# Patient Record
Sex: Male | Born: 1990 | Race: Black or African American | Hispanic: No | Marital: Single | Smoking: Current every day smoker
Health system: Southern US, Community
[De-identification: ages and names within clinical notes are randomized; demographics above are authoritative.]

---
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
## 2012-04-17 ENCOUNTER — Emergency Department (HOSPITAL_COMMUNITY)
Admission: EM | Admit: 2012-04-17 | Discharge: 2012-04-17 | Disposition: A | Discharge status: Home / Self Care | Payer: Self-pay | Attending: Emergency Medicine | Admitting: Emergency Medicine

## 2012-04-17 ENCOUNTER — Emergency Department (HOSPITAL_COMMUNITY): Payer: Self-pay

## 2012-04-17 ENCOUNTER — Encounter (HOSPITAL_COMMUNITY): Payer: Self-pay

## 2012-04-17 DIAGNOSIS — L03319 Cellulitis of trunk, unspecified: Secondary | ICD-10-CM | POA: Insufficient documentation

## 2012-04-17 DIAGNOSIS — A55 Chlamydial lymphogranuloma (venereum): Secondary | ICD-10-CM | POA: Insufficient documentation

## 2012-04-17 DIAGNOSIS — L02219 Cutaneous abscess of trunk, unspecified: Secondary | ICD-10-CM | POA: Insufficient documentation

## 2012-04-17 DIAGNOSIS — F17200 Nicotine dependence, unspecified, uncomplicated: Secondary | ICD-10-CM | POA: Insufficient documentation

## 2012-04-17 LAB — URINALYSIS, ROUTINE W REFLEX MICROSCOPIC
Glucose, UA: NEGATIVE mg/dL
Hgb urine dipstick: NEGATIVE
Ketones, ur: 15 mg/dL — AB
Nitrite: NEGATIVE
Protein, ur: NEGATIVE mg/dL
Specific Gravity, Urine: 1.027 (ref 1.005–1.030)
Urobilinogen, UA: 1 mg/dL (ref 0.0–1.0)
pH: 6.5 (ref 5.0–8.0)

## 2012-04-17 MED ORDER — LIDOCAINE HCL (PF) 1 % IJ SOLN
INTRAMUSCULAR | Status: AC
  Administered 2012-04-17: 5 mL via INTRAMUSCULAR
  Filled 2012-04-17: qty 5

## 2012-04-17 MED ORDER — ERYTHROMYCIN BASE 500 MG PO TABS: 500.0000 mg | ORAL_TABLET | Freq: Four times a day (QID) | ORAL | Status: AC

## 2012-04-17 MED ORDER — CEFTRIAXONE SODIUM 250 MG IJ SOLR
250.0000 mg | Freq: Once | INTRAMUSCULAR | Status: AC
  Administered 2012-04-17: 250 mg via INTRAMUSCULAR
  Filled 2012-04-17: qty 250

## 2012-04-17 MED ORDER — HYDROCODONE-ACETAMINOPHEN 5-325 MG PO TABS
2.0000 | ORAL_TABLET | Freq: Once | ORAL | Status: AC
  Administered 2012-04-17: 2 via ORAL
  Filled 2012-04-17: qty 2

## 2012-04-17 MED ORDER — HYDROCODONE-ACETAMINOPHEN 5-325 MG PO TABS: 2.0000 | ORAL_TABLET | Freq: Four times a day (QID) | ORAL | Status: AC | PRN

## 2012-04-17 NOTE — ED Provider Notes (Cosign Needed)
History  This chart was scribed for Ward Givens, MD by Erskine Emery. This patient was seen in room TR10C/TR10C and the patient's care was started at 14:35   CSN: [id number]  Arrival date & time 04/17/12  1324   First MD Initiated Contact with Patient 04/17/12 1435      Chief Complaint  Patient presents with  . Rash    (Consider location/radiation/quality/duration/timing/severity/associated sxs/prior treatment) HPI  Christian Ford is a 21 y.o. male who presents to the Emergency Department with rash. Pt reports a new sexual partner 2-3 weeks ago while at the beach without any condom usage. He reports he did have drainage from his penis that was white one or 2 weeks ago, however that has resolved. He denies any dysuria or frequency. He states he had painless lesions on his penis and shaft of his penis that are still persistent. He relates sometimes he gets scabs off of these lesions. He relates he started getting swelling in his left groin yesterday. He denies fever, or pain. Denies any new exposures or detergents. He states he did change his detergent however that was after he started getting a rash. Reports some rash on his body.  History reviewed. No pertinent past medical history.  History reviewed. No pertinent past surgical history.  History reviewed. No pertinent family history.  History  Substance Use Topics  . Smoking status: Current Everyday Smoker  . Smokeless tobacco: Not on file  . Alcohol Use: Yes  unemployed    Review of Systems  Constitutional: Negative for fever and chills.  Respiratory: Negative for shortness of breath.   Gastrointestinal: Negative for nausea and vomiting.  Genitourinary: Positive for discharge and penile pain. Negative for dysuria, urgency and frequency.  Skin: Positive for rash.  Neurological: Negative for weakness.    Allergies  Review of patient's allergies indicates no known allergies.  Home Medications   None   BP 108/63   Pulse 93  Temp 99.6 F (37.6 C) (Oral)  Resp 18  SpO2 98%   Vital signs normal    Physical Exam  Nursing note and vitals reviewed. Constitutional: He is oriented to person, place, and time. He appears well-developed and well-nourished. No distress.  HENT:  Head: Normocephalic and atraumatic.  Right Ear: External ear normal.  Left Ear: External ear normal.  Eyes: Conjunctivae and EOM are normal. Pupils are equal, round, and reactive to light.  Neck: Normal range of motion. Neck supple. No tracheal deviation present.  Pulmonary/Chest: Effort normal. No respiratory distress.  Abdominal: Soft. Bowel sounds are normal. He exhibits no distension. There is no tenderness. There is no rebound and no guarding.  Genitourinary:       Large swollen area in left inguinal area about 7x5 cm in size. Also some swelling inferior to inguinal fold. Early groove sign in left inguinal area.  Small papular lesions on head and shaft of penis. Circumcised. Excoriations and crustiness present. No penile drip. Normal testicles and scrotum.  Musculoskeletal: Normal range of motion.  Neurological: He is alert and oriented to person, place, and time.  Skin: Skin is warm and dry. Rash noted.       2 mm raised skin colored lesions on lateral and medial aspect of fingers and hands but not on palms. A few small round lesions on arms and waistline.   Psychiatric: He has a normal mood and affect. His behavior is normal.    ED Course  Procedures (including critical care time)  Medications  cefTRIAXone (ROCEPHIN) injection 250 mg (not administered)  HYDROcodone-acetaminophen (NORCO/VICODIN) 5-325 MG per tablet 2 tablet (2 tablet Oral Given 04/17/12 1519)   16:40 Dr Constance Goltz called about [place].   DIAGNOSTIC STUDIES: Oxygen Saturation is 98% on room air, normal by my interpretation.    COORDINATION OF CARE: 15:05--I evaluated the pt and we discussed treatment plan including ultrasound and STD testing with pt and pt  agreed.  15:15--Medication order: Hydrocodone-acetaminophen (Norco/Vicodin) 5-325 mg per tablet, 2 tablets--once  17:22--I rechecked the pt and notified him that we would be doing an incision and drainage procedure.   18:24--INCISION AND DRAINAGE PROCEDURE NOTE: Patient identification was confirmed and verbal consent was obtained. This procedure was performed by Ward Givens, MD at 6:19 PM.  I was unsuccessful in finding the abscess cavity.   Site: left inguinal in the middle of the swollen area Anesthetic used (type and amt): 1% epi Blade size: 11 Drainage: none  I used bedside ultrasound to try to localize the area of possible abscess the radiologist was talking about however it appeared to be in that same area and no drainage was obtained.  Site anesthetized, incision made over site, wound drained and explored loculations, covered with dry, sterile dressing.  Pt tolerated procedure well without complications.  Instructions for care discussed verbally and pt provided with additional written instructions for homecare and f/u.   Results for orders placed during the hospital encounter of 04/17/12  URINALYSIS, ROUTINE W REFLEX MICROSCOPIC      Component Value Range   Color, Urine AMBER (*) YELLOW   APPearance CLEAR  CLEAR   Specific Gravity, Urine 1.027  1.005 - 1.030   pH 6.5  5.0 - 8.0   Glucose, UA NEGATIVE  NEGATIVE mg/dL   Hgb urine dipstick NEGATIVE  NEGATIVE   Bilirubin Urine SMALL (*) NEGATIVE   Ketones, ur 15 (*) NEGATIVE mg/dL   Protein, ur NEGATIVE  NEGATIVE mg/dL   Urobilinogen, UA 1.0  0.0 - 1.0 mg/dL   Nitrite NEGATIVE  NEGATIVE   Leukocytes, UA TRACE (*) NEGATIVE  URINE MICROSCOPIC-ADD ON      Component Value Range   Squamous Epithelial / LPF RARE  RARE   WBC, UA 0-2  <3 WBC/hpf   Bacteria, UA RARE  RARE   Urine-Other MUCOUS PRESENT     Laboratory interpretation all normal except    US Pelvis Limited  04/17/2012  *RADIOLOGY REPORT*  Clinical Data:  Evaluate for abscess/lymphadenopathy.  US PELVIS LIMITED OR FOLLOW UP  Technique:  Complete ultrasound examination of the testicles, epididymis, and other scrotal structures was performed.  Comparison: None.  Findings: Just above the left scrotum is a complex 3.9 x 2.1 x 4.1 cm fluid appearing structure.  There is slight redness over this region per ultrasound technologist.  It is therefore possible this represents an abscess.  Superior to this level, lymph nodes measure 1.6 x 0.8 x 1.4 cm and 2.0 x 0.5 x 0.7 cm.  Results discussed with Dr. Lynelle Doctor 04/17/2012 4:40 p.m.  IMPRESSION: Just above the left scrotum is a complex 3.9 x 2.1 x 4.1 cm fluid appearing structure.  There is slight redness over this region per ultrasound technologist.  It is therefore possible this represents an abscess.  Superior to this level, lymph nodes measure 1.6 x 0.8 x 1.4 cm and 2.0 x 0.5 x 0.7 cm.  Original Report Authenticated By: Fuller Canada, M.D.    1. LGV (lymphogranuloma venereum)     Patient's Medications  New Prescriptions   ERYTHROMYCIN BASE (E-MYCIN) 500 MG TABLET    Take 1 tablet (500 mg total) by mouth 4 (four) times daily.   HYDROCODONE-ACETAMINOPHEN (NORCO/VICODIN) 5-325 MG PER TABLET    Take 2 tablets by mouth every 6 (six) hours as needed for pain.    Plan discharge  Devoria Albe, MD, FACEP   MDM   I personally performed the services described in this documentation, which was scribed in my presence. The recorded information has been reviewed and considered.  Devoria Albe, MD, Armando Gang          Ward Givens, MD 04/17/12 2036

## 2012-04-17 NOTE — ED Notes (Signed)
Pt a&ox4, airway intact, ambulates with a steady gait. Pt denies any questions regarding d/c instructions. Pt leaving with his mother.

## 2012-04-17 NOTE — ED Notes (Signed)
Pt complains of rash to body and sts broke out on his penis and knots in groin area.

## 2012-04-18 LAB — GC/CHLAMYDIA PROBE AMP, GENITAL
Chlamydia, DNA Probe: NEGATIVE
GC Probe Amp, Genital: NEGATIVE

## 2012-04-18 LAB — RPR: RPR Ser Ql: NONREACTIVE

## 2012-04-21 ENCOUNTER — Telehealth (HOSPITAL_COMMUNITY): Payer: Self-pay | Admitting: Emergency Medicine

## 2013-12-08 ENCOUNTER — Encounter (HOSPITAL_BASED_OUTPATIENT_CLINIC_OR_DEPARTMENT_OTHER): Payer: Self-pay | Admitting: Emergency Medicine

## 2013-12-08 ENCOUNTER — Emergency Department (HOSPITAL_BASED_OUTPATIENT_CLINIC_OR_DEPARTMENT_OTHER)
Admission: EM | Admit: 2013-12-08 | Discharge: 2013-12-08 | Disposition: A | Discharge status: Home / Self Care | Payer: Self-pay | Attending: Emergency Medicine | Admitting: Emergency Medicine

## 2013-12-08 DIAGNOSIS — R112 Nausea with vomiting, unspecified: Secondary | ICD-10-CM | POA: Insufficient documentation

## 2013-12-08 DIAGNOSIS — R509 Fever, unspecified: Secondary | ICD-10-CM | POA: Insufficient documentation

## 2013-12-08 DIAGNOSIS — R1084 Generalized abdominal pain: Secondary | ICD-10-CM | POA: Insufficient documentation

## 2013-12-08 DIAGNOSIS — F17200 Nicotine dependence, unspecified, uncomplicated: Secondary | ICD-10-CM | POA: Insufficient documentation

## 2013-12-08 DIAGNOSIS — R197 Diarrhea, unspecified: Secondary | ICD-10-CM | POA: Insufficient documentation

## 2013-12-08 LAB — URINALYSIS, ROUTINE W REFLEX MICROSCOPIC
BILIRUBIN URINE: NEGATIVE
Glucose, UA: NEGATIVE mg/dL
Hgb urine dipstick: NEGATIVE
KETONES UR: NEGATIVE mg/dL
Leukocytes, UA: NEGATIVE
NITRITE: NEGATIVE
PROTEIN: 30 mg/dL — AB
SPECIFIC GRAVITY, URINE: 1.018 (ref 1.005–1.030)
UROBILINOGEN UA: 1 mg/dL (ref 0.0–1.0)
pH: 6.5 (ref 5.0–8.0)

## 2013-12-08 LAB — URINE MICROSCOPIC-ADD ON: RBC / HPF: NONE SEEN RBC/hpf (ref <3)

## 2013-12-08 LAB — CBC WITH DIFFERENTIAL/PLATELET
BASOS PCT: 0 % (ref 0–1)
Basophils Absolute: 0 10*3/uL (ref 0.0–0.1)
EOS ABS: 0.1 10*3/uL (ref 0.0–0.7)
EOS PCT: 1 % (ref 0–5)
HCT: 45.5 % (ref 39.0–52.0)
Hemoglobin: 16.6 g/dL (ref 13.0–17.0)
Lymphocytes Relative: 13 % (ref 12–46)
Lymphs Abs: 1.3 10*3/uL (ref 0.7–4.0)
MCH: 30.9 pg (ref 26.0–34.0)
MCHC: 36.5 g/dL — AB (ref 30.0–36.0)
MCV: 84.7 fL (ref 78.0–100.0)
Monocytes Absolute: 1.2 10*3/uL — ABNORMAL HIGH (ref 0.1–1.0)
Monocytes Relative: 12 % (ref 3–12)
NEUTROS PCT: 74 % (ref 43–77)
Neutro Abs: 7.5 10*3/uL (ref 1.7–7.7)
PLATELETS: 167 10*3/uL (ref 150–400)
RBC: 5.37 MIL/uL (ref 4.22–5.81)
RDW: 12 % (ref 11.5–15.5)
WBC: 10.1 10*3/uL (ref 4.0–10.5)

## 2013-12-08 LAB — I-STAT CHEM 8, ED
BUN: <3 mg/dL — ABNORMAL LOW (ref 6–23)
Calcium, Ion: 1.25 mmol/L — ABNORMAL HIGH (ref 1.12–1.23)
Chloride: 98 mEq/L (ref 96–112)
Creatinine, Ser: 1.1 mg/dL (ref 0.50–1.35)
Glucose, Bld: 112 mg/dL — ABNORMAL HIGH (ref 70–99)
HEMATOCRIT: 51 % (ref 39.0–52.0)
HEMOGLOBIN: 17.3 g/dL — AB (ref 13.0–17.0)
POTASSIUM: 3.8 meq/L (ref 3.7–5.3)
SODIUM: 137 meq/L (ref 137–147)
TCO2: 26 mmol/L (ref 0–100)

## 2013-12-08 MED ORDER — PROMETHAZINE HCL 25 MG PO TABS: 25.0000 mg | ORAL_TABLET | Freq: Four times a day (QID) | ORAL | Status: AC | PRN

## 2013-12-08 MED ORDER — ONDANSETRON HCL 4 MG/2ML IJ SOLN
4.0000 mg | Freq: Once | INTRAMUSCULAR | Status: AC
  Administered 2013-12-08: 4 mg via INTRAVENOUS
  Filled 2013-12-08: qty 2

## 2013-12-08 MED ORDER — SODIUM CHLORIDE 0.9 % IV BOLUS (SEPSIS)
1000.0000 mL | Freq: Once | INTRAVENOUS | Status: AC
  Administered 2013-12-08: 1000 mL via INTRAVENOUS

## 2013-12-08 MED ORDER — DICYCLOMINE HCL 20 MG PO TABS: 20.0000 mg | ORAL_TABLET | Freq: Two times a day (BID) | ORAL | Status: AC

## 2013-12-08 MED ORDER — DICYCLOMINE HCL 10 MG PO CAPS
10.0000 mg | ORAL_CAPSULE | Freq: Once | ORAL | Status: AC
  Administered 2013-12-08: 10 mg via ORAL
  Filled 2013-12-08: qty 1

## 2013-12-08 NOTE — Discharge Instructions (Signed)
Viral Gastroenteritis Viral gastroenteritis is also known as stomach flu. This condition affects the stomach and intestinal tract. It can cause sudden diarrhea and vomiting. The illness typically lasts 3 to 8 days. Most people develop an immune response that eventually gets rid of the virus. While this natural response develops, the virus can make you quite ill. CAUSES  Many different viruses can cause gastroenteritis, such as rotavirus or noroviruses. You can catch one of these viruses by consuming contaminated food or water. You may also catch a virus by sharing utensils or other personal items with an infected person or by touching a contaminated surface. SYMPTOMS  The most common symptoms are diarrhea and vomiting. These problems can cause a severe loss of body fluids (dehydration) and a body salt (electrolyte) imbalance. Other symptoms may include:  Fever.  Headache.  Fatigue.  Abdominal pain. DIAGNOSIS  Your caregiver can usually diagnose viral gastroenteritis based on your symptoms and a physical exam. A stool sample may also be taken to test for the presence of viruses or other infections. TREATMENT  This illness typically goes away on its own. Treatments are aimed at rehydration. The most serious cases of viral gastroenteritis involve vomiting so severely that you are not able to keep fluids down. In these cases, fluids must be given through an intravenous line (IV). HOME CARE INSTRUCTIONS   Drink enough fluids to keep your urine clear or pale yellow. Drink small amounts of fluids frequently and increase the amounts as tolerated.  Ask your caregiver for specific rehydration instructions.  Avoid:  Foods high in sugar.  Alcohol.  Carbonated drinks.  Tobacco.  Juice.  Caffeine drinks.  Extremely hot or cold fluids.  Fatty, greasy foods.  Too much intake of anything at one time.  Dairy products until 24 to 48 hours after diarrhea stops.  You may consume probiotics.  Probiotics are active cultures of beneficial bacteria. They may lessen the amount and number of diarrheal stools in adults. Probiotics can be found in yogurt with active cultures and in supplements.  Wash your hands well to avoid spreading the virus.  Only take over-the-counter or prescription medicines for pain, discomfort, or fever as directed by your caregiver. Do not give aspirin to children. Antidiarrheal medicines are not recommended.  Ask your caregiver if you should continue to take your regular prescribed and over-the-counter medicines.  Keep all follow-up appointments as directed by your caregiver. SEEK IMMEDIATE MEDICAL CARE IF:   You are unable to keep fluids down.  You do not urinate at least once every 6 to 8 hours.  You develop shortness of breath.  You notice blood in your stool or vomit. This may look like coffee grounds.  You have abdominal pain that increases or is concentrated in one small area (localized).  You have persistent vomiting or diarrhea.  You have a fever.  The patient is a child younger than 3 months, and he or she has a fever.  The patient is a child older than 3 months, and he or she has a fever and persistent symptoms.  The patient is a child older than 3 months, and he or she has a fever and symptoms suddenly get worse.  The patient is a baby, and he or she has no tears when crying. MAKE SURE YOU:   Understand these instructions.  Will watch your condition.  Will get help right away if you are not doing well or get worse. Document Released: 09/16/2005 Document Revised: 12/09/2011 Document Reviewed: 07/03/2011   ExitCare Patient Information 2014 [employer].  

## 2013-12-08 NOTE — ED Notes (Signed)
Nausea and vomiting diarrhea yesterday today only diarrhea and having some mid abdominal pain.

## 2013-12-08 NOTE — ED Provider Notes (Signed)
TIME SEEN: 9:31 AM  CHIEF COMPLAINT: Vomiting, diarrhea, abdominal pain  HPI: Patient is a 23 year old male with no significant past medical history who presents to the emergency department with 3 days of nausea, vomiting and diarrhea. He's also complaining of diffuse abdominal cramping. States he has had low-grade fever at home. No chills. No sick contacts, recent travel, hospitalization or antibiotic use. Denies any dysuria, hematuria, penile discharge, testicular pain or scrotal swelling. No bloody stool or melena.  ROS: See HPI Constitutional: no fever  Eyes: no drainage  ENT: no runny nose   Cardiovascular:  no chest pain  Resp: no SOB  GI:  vomiting GU: no dysuria Integumentary: no rash  Allergy: no hives  Musculoskeletal: no leg swelling  Neurological: no slurred speech ROS otherwise negative  PAST MEDICAL HISTORY/PAST SURGICAL HISTORY:  History reviewed. No pertinent past medical history.  MEDICATIONS:  Prior to Admission medications   Not on File    ALLERGIES:  No Known Allergies  SOCIAL HISTORY:  History  Substance Use Topics  . Smoking status: Current Every Day Smoker  . Smokeless tobacco: Not on file  . Alcohol Use: Yes    FAMILY HISTORY: History reviewed. No pertinent family history.  EXAM: BP 124/83  Pulse 78  Temp(Src) 98.3 F (36.8 C) (Oral)  Resp 16  Ht 5\' 9"  (1.753 m)  Wt 165 lb (74.844 kg)  BMI 24.36 kg/m2  SpO2 100% CONSTITUTIONAL: Alert and oriented and responds appropriately to questions. Well-appearing; well-nourished, in no apparent distress HEAD: Normocephalic EYES: Conjunctivae clear, PERRL ENT: normal nose; no rhinorrhea; slightly dry mucous membranes; pharynx without lesions noted NECK: Supple, no meningismus, no LAD  CARD: RRR; S1 and S2 appreciated; no murmurs, no clicks, no rubs, no gallops RESP: Normal chest excursion without splinting or tachypnea; breath sounds clear and equal bilaterally; no wheezes, no rhonchi, no rales,   ABD/GI: Normal bowel sounds; non-distended; soft, non-tender, no rebound, no guarding; no peritoneal signs BACK:  The back appears normal and is non-tender to palpation, there is no CVA tenderness EXT: Normal ROM in all joints; non-tender to palpation; no edema; normal capillary refill; no cyanosis    SKIN: Normal color for age and race; warm NEURO: Moves all extremities equally PSYCH: The patient's mood and manner are appropriate. Grooming and personal hygiene are appropriate.  MEDICAL DECISION MAKING: Patient here with vomiting, diarrhea and crampy abdominal pain. Suspect viral illness. His abdominal exam is completely benign. His vital signs within normal limits. We'll check basic labs, urine. Will give IV fluids, Zofran and Bentyl.  ED PROGRESS: Patient's labs and urine are completely unremarkable. He has no leukocytosis or electrolyte abnormality. His anion gap is 13. He states he feels much better after Zofran and Bentyl. We'll discharge with prescription for the same. Have given return precautions. Patient and family at bedside verbalize understanding and are comfortable with this plan.     Layla MawKristen N Ward, DO 12/08/13 1038

## 2014-02-14 IMAGING — US US PELVIS LIMITED
1 series · 14 of 25 positions shown · non-contrast
Comparison: None.

CLINICAL DATA: Evaluate for abscess/lymphadenopathy.

US PELVIS LIMITED OR FOLLOW UP
TECHNIQUE: Complete ultrasound examination of the testicles,
epididymis, and other scrotal structures was performed.

[Series 1: us pelvis limited · 0.09mm/px · 14 of 47 slices shown]
[im 1/47]
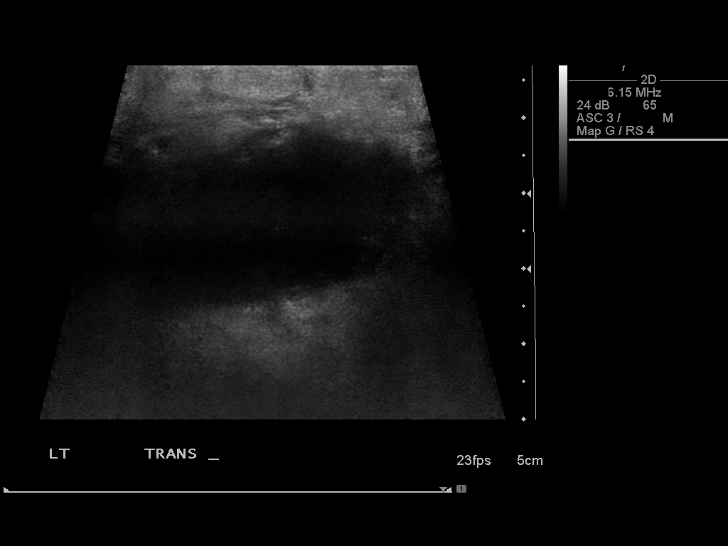
[im 4/47]
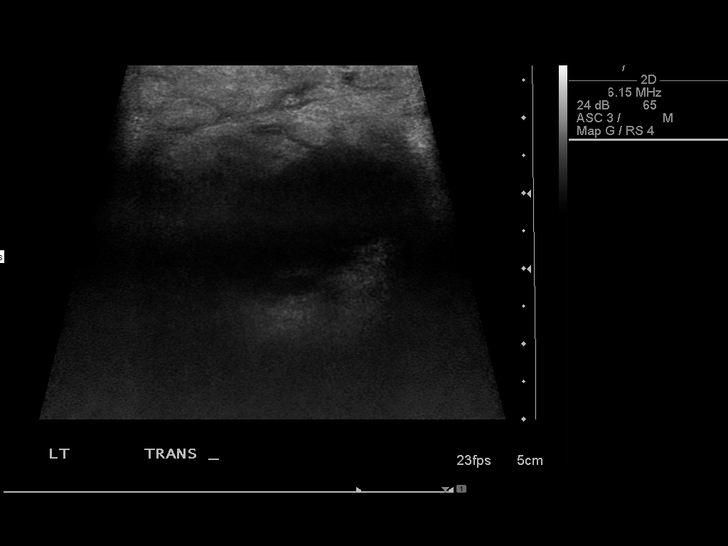
[im 8/47]
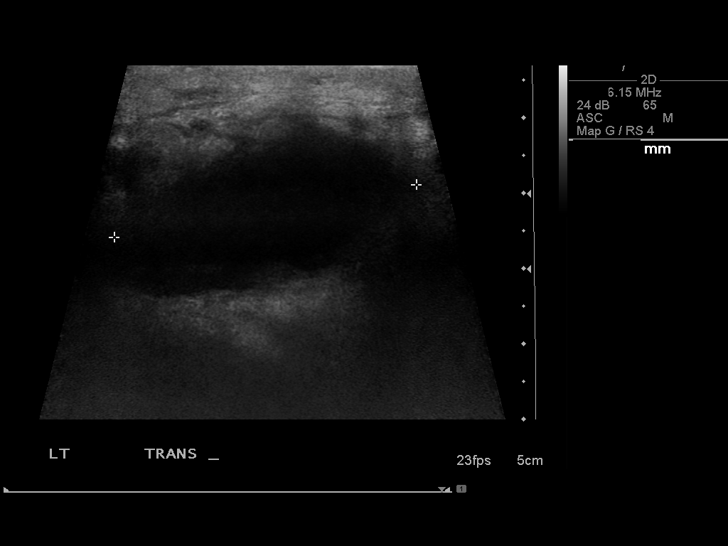
[im 12/47]
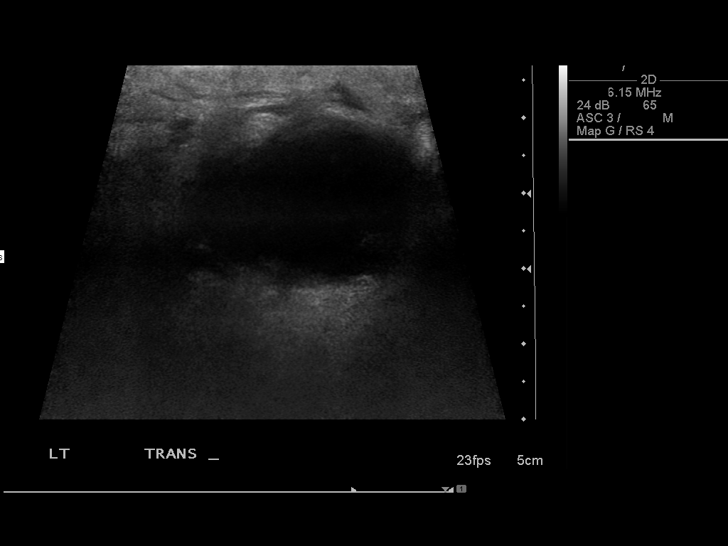
[im 16/47]
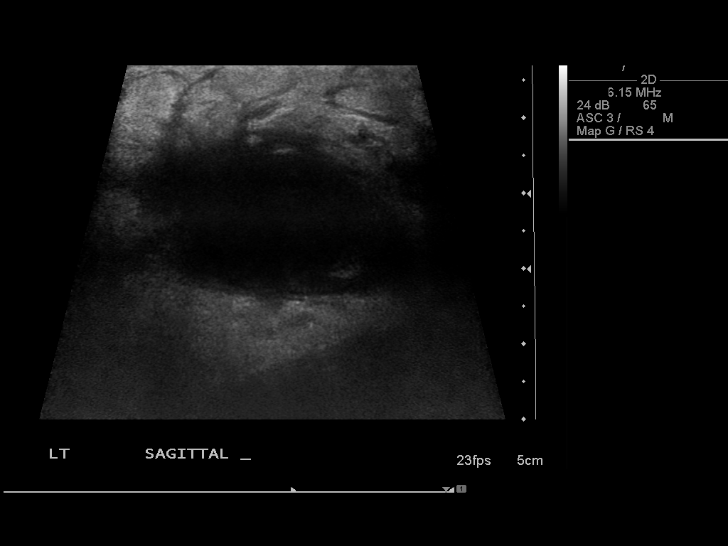
[im 18/47]
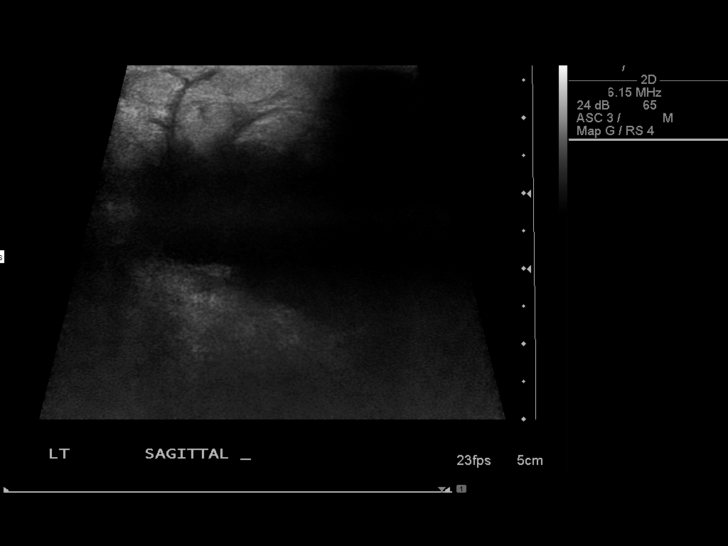
[im 22/47]
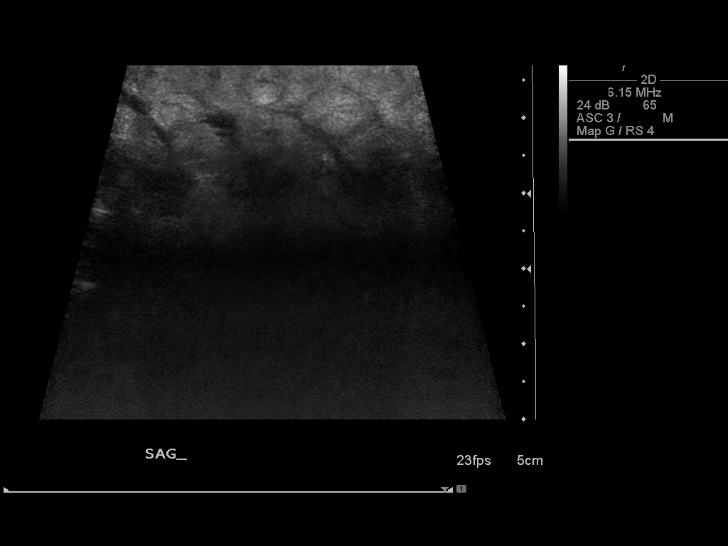
[im 25/47]
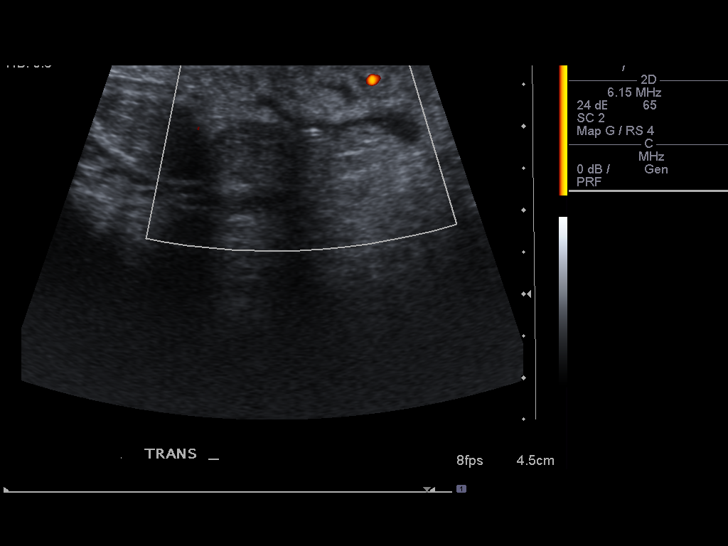
[im 29/47]
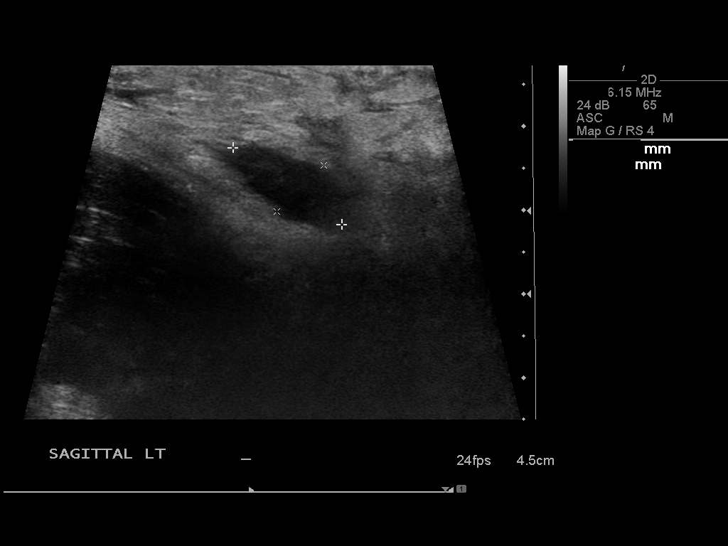
[im 31/47]
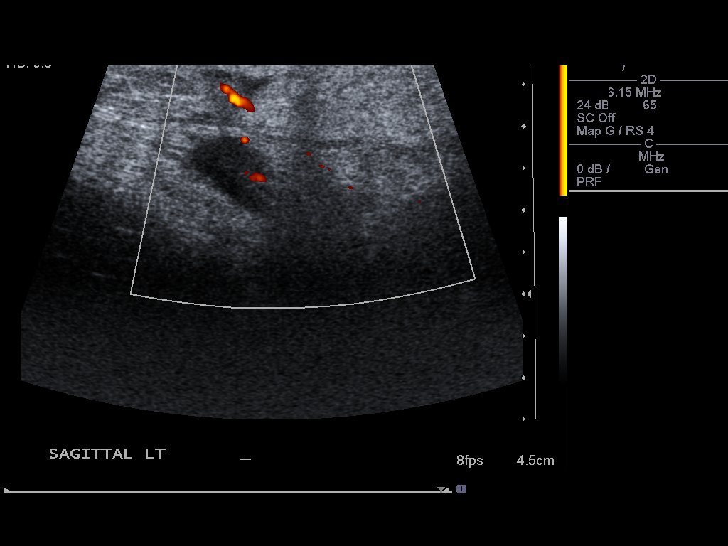
[im 35/47]
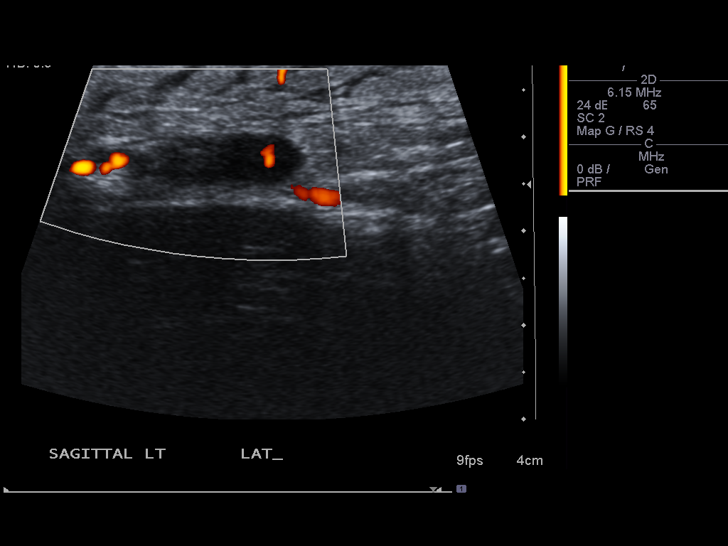
[im 39/47]
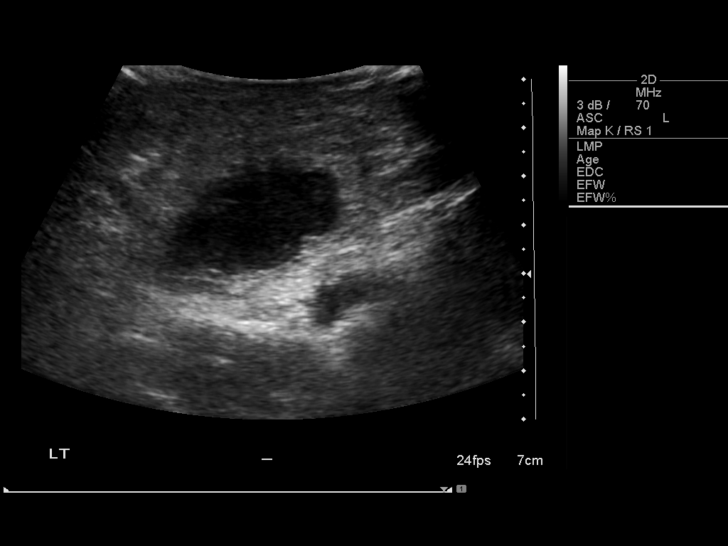
[im 43/47]
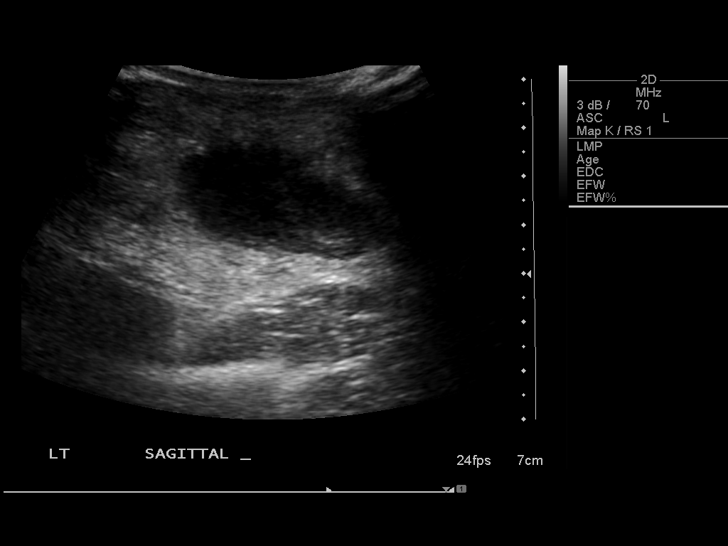
[im 47/47]
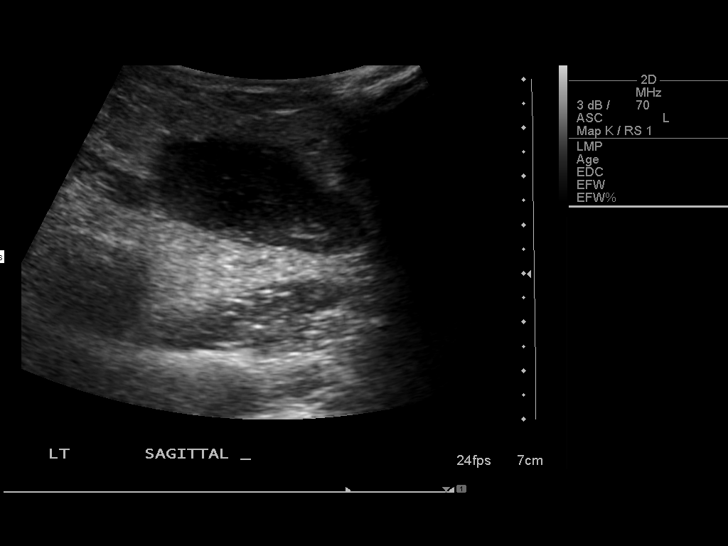

[14 of 25 positions shown; findings below may reference images not displayed]

FINDINGS: Just above the left scrotum is a complex 3.9 x 2.1 x
cm fluid appearing structure.  There is slight redness over this
region per ultrasound technologist.  It is therefore possible this
represents an abscess.

Superior to this level, lymph nodes measure 1.6 x 0.8 x 1.4 cm and
2.0 x 0.5 x 0.7 cm.

Results discussed with Dr. Koboka 04/17/2012 [DATE] p.m..
IMPRESSION: Just above the left scrotum is a complex 3.9 x 2.1 x 4.1 cm fluid
appearing structure.  There is slight redness over this region per
ultrasound technologist.  It is therefore possible this represents
an abscess.

Superior to this level, lymph nodes measure 1.6 x 0.8 x 1.4 cm and
2.0 x 0.5 x 0.7 cm.

## 2014-09-29 ENCOUNTER — Emergency Department (HOSPITAL_COMMUNITY)
Admission: EM | Admit: 2014-09-29 | Discharge: 2014-09-29 | Disposition: A | Discharge status: Home / Self Care | Payer: Self-pay | Attending: Emergency Medicine | Admitting: Emergency Medicine

## 2014-09-29 ENCOUNTER — Encounter (HOSPITAL_COMMUNITY): Payer: Self-pay | Admitting: *Deleted

## 2014-09-29 DIAGNOSIS — R0981 Nasal congestion: Secondary | ICD-10-CM | POA: Insufficient documentation

## 2014-09-29 DIAGNOSIS — R61 Generalized hyperhidrosis: Secondary | ICD-10-CM | POA: Insufficient documentation

## 2014-09-29 DIAGNOSIS — R059 Cough, unspecified: Secondary | ICD-10-CM

## 2014-09-29 DIAGNOSIS — J029 Acute pharyngitis, unspecified: Secondary | ICD-10-CM | POA: Insufficient documentation

## 2014-09-29 DIAGNOSIS — R05 Cough: Secondary | ICD-10-CM | POA: Insufficient documentation

## 2014-09-29 DIAGNOSIS — Z720 Tobacco use: Secondary | ICD-10-CM | POA: Insufficient documentation

## 2014-09-29 DIAGNOSIS — R509 Fever, unspecified: Secondary | ICD-10-CM | POA: Insufficient documentation

## 2014-09-29 MED ORDER — ACETAMINOPHEN 325 MG PO TABS
650.0000 mg | ORAL_TABLET | Freq: Once | ORAL | Status: AC
  Administered 2014-09-29: 650 mg via ORAL
  Filled 2014-09-29: qty 2

## 2014-09-29 MED ORDER — AZITHROMYCIN 250 MG PO TABS: 250.0000 mg | ORAL_TABLET | Freq: Every day | ORAL | Status: AC

## 2014-09-29 MED ORDER — ACETAMINOPHEN-CODEINE #3 300-30 MG PO TABS: 1.0000 | ORAL_TABLET | Freq: Four times a day (QID) | ORAL | Status: AC | PRN

## 2014-09-29 NOTE — ED Notes (Signed)
Pt states he has had cough, sore throat, congestion, and fever since Sunday. Pt states he took Dayquil today at 12PM, which did not provide relief.

## 2014-09-29 NOTE — Progress Notes (Signed)
  CARE MANAGEMENT ED NOTE 09/29/2014  Patient:  Bertram SavinMARTIN,Jenaro J   Account Number:  [id number]  Date Initiated:  09/29/2014  Documentation initiated by:  Radford PaxFERRERO,Tanishia Lemaster  Subjective/Objective Assessment:   Patient presents to the Ed with ough, fever sore thraot and congestion     Subjective/Objective Assessment Detail:     Action/Plan:   Action/Plan Detail:   Anticipated DC Date:  09/29/2014     Status Recommendation to Physician:   Result of Recommendation:    Other ED Services  Consult Working Plan    DC Planning Services  Other  PCP issues  Medication Assistance    Choice offered to / List presented to:            Status of service:  Completed, signed off  ED Comments:   ED Comments Detail:  EDCM spoke to patient at bedside. Patient confirms he [place] have a pcp [place]. EDCM provide patient [facility], informed patient of services there and walk in times.  EDCM also provided patient with list of pcps who accept self pay patients, list of discount pharmacies and websites needymeds.org and GoodRX.com for medication assistance, phone number to inquire about the orange card, phone number to inquire about Mediciad, phone number to inquire about the Affordable Care Act, financial resources in the community such as local churches, salvation [employer], urban ministries, and dental assistance for uninsured patients. EDCM provided patient with discount cared for zithromax from website GoodRX.com  Patient thankful for resources. No further EDCM needs at this time.

## 2014-09-29 NOTE — Discharge Instructions (Signed)
Although your symptoms may be related to a viral infection, it can also be related to a bacterial infection called pneumonia. Take cough medication as needed, and take antibiotic as prescribed for the full duration.  Return if your condition worsen or if you have other concerns.  Pneumonia Pneumonia is an infection of the lungs.  CAUSES Pneumonia may be caused by bacteria or a virus. Usually, these infections are caused by breathing infectious particles into the lungs (respiratory tract). SIGNS AND SYMPTOMS   Cough.  Fever.  Chest pain.  Increased rate of breathing.  Wheezing.  Mucus production. DIAGNOSIS  If you have the common symptoms of pneumonia, your health care provider will typically confirm the diagnosis with a chest X-ray. The X-ray will show an abnormality in the lung (pulmonary infiltrate) if you have pneumonia. Other tests of your blood, urine, or sputum may be done to find the specific cause of your pneumonia. Your health care provider may also do tests (blood gases or pulse oximetry) to see how well your lungs are working. TREATMENT  Some forms of pneumonia may be spread to other people when you cough or sneeze. You may be asked to wear a mask before and during your exam. Pneumonia that is caused by bacteria is treated with antibiotic medicine. Pneumonia that is caused by the influenza virus may be treated with an antiviral medicine. Most other viral infections must run their course. These infections will not respond to antibiotics.  HOME CARE INSTRUCTIONS   Cough suppressants may be used if you are losing too much rest. However, coughing protects you by clearing your lungs. You should avoid using cough suppressants if you can.  Your health care provider may have prescribed medicine if he or she thinks your pneumonia is caused by bacteria or influenza. Finish your medicine even if you start to feel better.  Your health care provider may also prescribe an expectorant. This  loosens the mucus to be coughed up.  Take medicines only as directed by your health care provider.  Do not smoke. Smoking is a common cause of bronchitis and can contribute to pneumonia. If you are a smoker and continue to smoke, your cough may last several weeks after your pneumonia has cleared.  A cold steam vaporizer or humidifier in your room or home may help loosen mucus.  Coughing is often worse at night. Sleeping in a semi-upright position in a recliner or using a couple pillows under your head will help with this.  Get rest as you feel it is needed. Your body will usually let you know when you need to rest. PREVENTION A pneumococcal shot (vaccine) is available to prevent a common bacterial cause of pneumonia. This is usually suggested for:  People over 24 years old.  Patients on chemotherapy.  People with chronic lung problems, such as bronchitis or emphysema.  People with immune system problems. If you are over 65 or have a high risk condition, you may receive the pneumococcal vaccine if you have not received it before. In some countries, a routine influenza vaccine is also recommended. This vaccine can help prevent some cases of pneumonia.You may be offered the influenza vaccine as part of your care. If you smoke, it is time to quit. You may receive instructions on how to stop smoking. Your health care provider can provide medicines and counseling to help you quit. SEEK MEDICAL CARE IF: You have a fever. SEEK IMMEDIATE MEDICAL CARE IF:   Your illness becomes worse. This is  especially true if you are elderly or weakened from any other disease.  You cannot control your cough with suppressants and are losing sleep.  You begin coughing up blood.  You develop pain which is getting worse or is uncontrolled with medicines.  Any of the symptoms which initially brought you in for treatment are getting worse rather than better.  You develop shortness of breath or chest  pain. MAKE SURE YOU:   Understand these instructions.  Will watch your condition.  Will get help right away if you are not doing well or get worse. Document Released: 09/16/2005 Document Revised: 01/31/2014 Document Reviewed: 12/06/2010 [facility] Patient Information 2015 [place]. This information is not intended to replace advice given to you by your health care provider. Make sure you discuss any questions you have with your health care provider.

## 2014-09-29 NOTE — ED Provider Notes (Signed)
CSN: [id number]     Arrival date & time 09/29/14  1711 History  This chart was scribed for non-physician practitioner, Fayrene HelperBowie Ki Luckman, PA-C,working with Suzi RootsKevin E Steinl, MD, by Karle PlumberJennifer Tensley, ED Scribe. This patient was seen in room WTR5/WTR5 and the patient's care was started at 6:17 PM.  Chief Complaint  Patient presents with  . Fever  . Cough  . Nasal Congestion   HPI  HPI Comments:  Christian Ford is a 23 y.o. male who presents to the Emergency Department complaining of severe cough, intermittent fever and chest congestion that began four days ago. He reports sore throat and chest soreness secondary to coughing. Pt has been taking NyQuil and DayQuil with no significant relief of the symptoms. He denies any modifying factors. Denies rhinorrhea, sneezing, otalgia, nausea, vomiting or diarrhea. Pt endorses he is a smoker.  History reviewed. No pertinent past medical history. History reviewed. No pertinent past surgical history. No family history on file. History  Substance Use Topics  . Smoking status: Current Every Day Smoker  . Smokeless tobacco: Not on file  . Alcohol Use: Yes    Review of Systems  Constitutional: Positive for fever and diaphoresis.  HENT: Positive for congestion and sore throat. Negative for ear pain, rhinorrhea and sneezing.   Respiratory: Positive for cough.   Gastrointestinal: Negative for nausea, vomiting and diarrhea.  All other systems reviewed and are negative.   Allergies  Review of patient's allergies indicates no known allergies.  Home Medications   Prior to Admission medications   Medication Sig Start Date End Date Taking? Authorizing Provider  Phenyleph-Doxylamine-DM-APAP (ALKA SELTZER PLUS PO) Take by mouth.   Yes Historical Provider, MD  Pseudoeph-Doxylamine-DM-APAP (NYQUIL PO) Take by mouth.   Yes Historical Provider, MD  Pseudoephedrine-APAP-DM (DAYQUIL PO) Take 30 mLs by mouth 2 (two) times daily as needed (cold and flu symptoms).    Yes  Historical Provider, MD  Throat Lozenges (COUGH DROPS MT) Use as directed 1 lozenge in the mouth or throat as needed (cough).   Yes Historical Provider, MD  dicyclomine (BENTYL) 20 MG tablet Take 1 tablet (20 mg total) by mouth 2 (two) times daily. As needed for abdominal cramping Patient not taking: Reported on 09/29/2014 12/08/13   Kristen N Ward, DO  promethazine (PHENERGAN) 25 MG tablet Take 1 tablet (25 mg total) by mouth every 6 (six) hours as needed for nausea or vomiting. Patient not taking: Reported on 09/29/2014 12/08/13   Layla MawKristen N Ward, DO   Triage Vitals: BP 130/79 mmHg  Pulse 93  Temp(Src) 100.6 F (38.1 C) (Oral)  Resp 18  SpO2 99% Physical Exam  HENT:  Head: Normocephalic and atraumatic.  Right Ear: Tympanic membrane normal.  Left Ear: Tympanic membrane normal.  Nose: Nose normal.  Mouth/Throat: Uvula is midline, oropharynx is clear and moist and mucous membranes are normal.  Cardiovascular: Normal rate, regular rhythm and normal heart sounds.  Exam reveals no gallop and no friction rub.   No murmur heard. Pulmonary/Chest: Effort normal and breath sounds normal. No respiratory distress. He has no wheezes. He has no rales.    ED Course  Procedures (including critical care time) DIAGNOSTIC STUDIES: Oxygen Saturation is 99% on RA, normal by my interpretation.   COORDINATION OF CARE: 6:21 PM- Suggested CXR but pt declined secondary to not having insurance. Will treat with antibiotic to cover for possible bacterial infection. Pt verbalizes understanding and agrees to plan.  Medications  acetaminophen (TYLENOL) tablet 650 mg (650 mg Oral  Given 09/29/14 1756)   Labs Review Labs Reviewed - No data to display  Imaging Review No results found.   EKG Interpretation None      MDM   Final diagnoses:  Fever, unspecified fever cause  Cough    BP 130/79 mmHg  Pulse 93  Temp(Src) 100.6 F (38.1 C) (Oral)  Resp 18  SpO2 99%   I personally performed the  services described in this documentation, which was scribed in my presence. The recorded information has been reviewed and is accurate.    Fayrene HelperBowie Annaliyah Willig, PA-C 09/29/14 1827  Suzi RootsKevin E Steinl, MD 09/29/14 2001

## 2015-01-11 ENCOUNTER — Emergency Department (HOSPITAL_COMMUNITY)
Admission: EM | Admit: 2015-01-11 | Discharge: 2015-01-11 | Disposition: A | Discharge status: Home / Self Care | Payer: Self-pay | Attending: Emergency Medicine | Admitting: Emergency Medicine

## 2015-01-11 ENCOUNTER — Encounter (HOSPITAL_COMMUNITY): Payer: Self-pay | Admitting: *Deleted

## 2015-01-11 ENCOUNTER — Emergency Department (HOSPITAL_COMMUNITY): Payer: Self-pay

## 2015-01-11 DIAGNOSIS — J40 Bronchitis, not specified as acute or chronic: Secondary | ICD-10-CM | POA: Insufficient documentation

## 2015-01-11 DIAGNOSIS — Z720 Tobacco use: Secondary | ICD-10-CM | POA: Insufficient documentation

## 2015-01-11 DIAGNOSIS — Z792 Long term (current) use of antibiotics: Secondary | ICD-10-CM | POA: Insufficient documentation

## 2015-01-11 MED ORDER — ALBUTEROL SULFATE HFA 108 (90 BASE) MCG/ACT IN AERS
2.0000 | INHALATION_SPRAY | Freq: Once | RESPIRATORY_TRACT | Status: AC
  Administered 2015-01-11: 2 via RESPIRATORY_TRACT
  Filled 2015-01-11: qty 6.7

## 2015-01-11 MED ORDER — IPRATROPIUM-ALBUTEROL 0.5-2.5 (3) MG/3ML IN SOLN
3.0000 mL | Freq: Once | RESPIRATORY_TRACT | Status: AC
  Administered 2015-01-11: 3 mL via RESPIRATORY_TRACT
  Filled 2015-01-11: qty 3

## 2015-01-11 MED ORDER — HYDROCOD POLST-CHLORPHEN POLST 10-8 MG/5ML PO LQCR: 5.0000 mL | Freq: Two times a day (BID) | ORAL | Status: AC | PRN

## 2015-01-11 MED ORDER — DEXAMETHASONE SODIUM PHOSPHATE 10 MG/ML IJ SOLN
10.0000 mg | Freq: Once | INTRAMUSCULAR | Status: AC
  Administered 2015-01-11: 10 mg via INTRAMUSCULAR
  Filled 2015-01-11: qty 1

## 2015-01-11 NOTE — Discharge Instructions (Signed)
Read the information below.  Use the prescribed medication as directed.  Please discuss all new medications with your pharmacist.  You may return to the Emergency Department at any time for worsening condition or any new symptoms that concern you.  If you develop high fevers that do not resolve with tylenol or ibuprofen, you have difficulty swallowing or breathing, or you are unable to tolerate fluids by mouth, return to the ER for a recheck.    ° ° °Viral Infections °A virus is a type of germ. Viruses can cause: °· Minor sore throats. °· Aches and pains. °· Headaches. °· Runny nose. °· Rashes. °· Watery eyes. °· Tiredness. °· Coughs. °· Loss of appetite. °· Feeling sick to your stomach (nausea). °· Throwing up (vomiting). °· Watery poop (diarrhea). °HOME CARE  °· Only take medicines as told by your doctor. °· Drink enough water and fluids to keep your pee (urine) clear or pale yellow. Sports drinks are a good choice. °· Get plenty of rest and eat healthy. Soups and broths with crackers or rice are fine. °GET HELP RIGHT AWAY IF:  °· You have a very bad headache. °· You have shortness of breath. °· You have chest pain or neck pain. °· You have an unusual rash. °· You cannot stop throwing up. °· You have watery poop that does not stop. °· You cannot keep fluids down. °· You or your child has a temperature by mouth above 102° F (38.9° C), not controlled by medicine. °· Your baby is older than 3 months with a rectal temperature of 102° F (38.9° C) or higher. °· Your baby is 3 months old or younger with a rectal temperature of 100.4° F (38° C) or higher. °MAKE SURE YOU:  °· Understand these instructions. °· Will watch this condition. °· Will get help right away if you are not doing well or get worse. °Document Released: 08/29/2008 Document Revised: 12/09/2011 Document Reviewed: 01/22/2011 °Exit[employer]® Patient Information ©2015 [employer]. This information is not intended to replace advice given to you by your health  care provider. Make sure you discuss any questions you have with your health care provider. ° °

## 2015-01-11 NOTE — ED Provider Notes (Signed)
CSN: [id number]     Arrival date & time 01/11/15  2008 History  This chart was scribed for Trixie DredgeEmily Von Quintanar, PA-C with Benjiman CoreNathan Pickering, MD by Tonye RoyaltyJoshua Chen, ED Scribe. This patient was seen in room TR06C/TR06C and the patient's care was started at 9:09 PM.    Chief Complaint  Patient presents with  . Generalized Body Aches   HPI  HPI Comments: Christian Ford is a 24 y.o. male who presents to the Emergency Department complaining of cough that is minimally productive with onset 3 days ago. He states phlegm is white and yellow and somewhat thick. He reports associated chills, sweating, sore throat, rhinorrhea, nasal congestion, chest congestion, and body aches. He denies SOB but notes his girlfriend told him he was wheezing last night. He smokes. He denies history of asthma. His girlfriend is sick with similar symptoms. He has used OTC medications for his symptoms, administered by his girlfriend.  History reviewed. No pertinent past medical history. History reviewed. No pertinent past surgical history. No family history on file. History  Substance Use Topics  . Smoking status: Current Every Day Smoker  . Smokeless tobacco: Not on file  . Alcohol Use: Yes    Review of Systems  Constitutional: Positive for chills and diaphoresis.  HENT: Positive for congestion, rhinorrhea, sneezing and sore throat.   Respiratory: Positive for cough and wheezing. Negative for shortness of breath.   Musculoskeletal: Positive for myalgias. Negative for neck stiffness.  Skin: Negative for rash.  Allergic/Immunologic: Negative for immunocompromised state.  Psychiatric/Behavioral: Negative for self-injury.      Allergies  Review of patient's allergies indicates no known allergies.  Home Medications   Prior to Admission medications   Medication Sig Start Date End Date Taking? Authorizing Provider  acetaminophen-codeine (TYLENOL #3) 300-30 MG per tablet Take 1 tablet by mouth every 6 (six) hours as needed for  moderate pain. 09/29/14   Fayrene HelperBowie Tran, PA-C  azithromycin (ZITHROMAX) 250 MG tablet Take 1 tablet (250 mg total) by mouth daily. 09/29/14   Fayrene HelperBowie Tran, PA-C  dicyclomine (BENTYL) 20 MG tablet Take 1 tablet (20 mg total) by mouth 2 (two) times daily. As needed for abdominal cramping Patient not taking: Reported on 09/29/2014 12/08/13   Kristen N Ward, DO  Phenyleph-Doxylamine-DM-APAP (ALKA SELTZER PLUS PO) Take by mouth.    Historical Provider, MD  promethazine (PHENERGAN) 25 MG tablet Take 1 tablet (25 mg total) by mouth every 6 (six) hours as needed for nausea or vomiting. Patient not taking: Reported on 09/29/2014 12/08/13   Kristen N Ward, DO  Pseudoeph-Doxylamine-DM-APAP (NYQUIL PO) Take by mouth.    Historical Provider, MD  Pseudoephedrine-APAP-DM (DAYQUIL PO) Take 30 mLs by mouth 2 (two) times daily as needed (cold and flu symptoms).     Historical Provider, MD  Throat Lozenges (COUGH DROPS MT) Use as directed 1 lozenge in the mouth or throat as needed (cough).    Historical Provider, MD   BP 122/63 mmHg  Pulse 103  Temp(Src) 98.6 F (37 C)  Resp 18  Ht 5\' 9"  (1.753 m)  Wt 170 lb (77.111 kg)  BMI 25.09 kg/m2  SpO2 97% Physical Exam  Constitutional: He appears well-developed and well-nourished. No distress.  HENT:  Head: Normocephalic and atraumatic.  Nose: Right sinus exhibits no maxillary sinus tenderness and no frontal sinus tenderness. Left sinus exhibits no maxillary sinus tenderness and no frontal sinus tenderness.  Mouth/Throat: Oropharynx is clear and moist. No oropharyngeal exudate.  mucosal edema and erythema Posterior oropharynx  erythema, no edema or exudate  Eyes: Conjunctivae and EOM are normal. Right eye exhibits no discharge. Left eye exhibits no discharge.  Neck: Normal range of motion. Neck supple.  Cardiovascular: Normal rate and regular rhythm.   Pulmonary/Chest: Effort normal. No stridor. No respiratory distress. He has wheezes. He has no rhonchi. He has no rales.   Lymphadenopathy:    He has no cervical adenopathy.  Neurological: He is alert.  Skin: He is not diaphoretic.  Nursing note and vitals reviewed.   ED Course  Procedures (including critical care time)  DIAGNOSTIC STUDIES: Oxygen Saturation is 97% on room air, normal by my interpretation.    COORDINATION OF CARE: 9:15 PM Reviewed chest x-ray results with patient which reveal no evidence of pneumonia. Discussed treatment plan with patient at beside, including breathing treatment, The patient agrees with the plan and has no further questions at this time.   Labs Review Labs Reviewed - No data to display  Imaging Review Dg Chest 2 View  01/11/2015   CLINICAL DATA:  Productive cough and chills for 4 days  EXAM: CHEST  2 VIEW  COMPARISON:  None.  FINDINGS: There is no edema or consolidation. The heart size and pulmonary vascularity are normal. No adenopathy. No bone lesions.  IMPRESSION: No edema or consolidation.   Electronically Signed   By: Bretta Bang III M.D.   On: 01/11/2015 20:55     EKG Interpretation None      MDM   Final diagnoses:  Bronchitis    Afebrile, nontoxic patient with constellation of symptoms suggestive of viral syndrome/bronchitis.  No concerning findings on exam.  Discharged home with supportive care, PCP follow up.  Discussed result, findings, treatment, and follow up  with patient.  Pt given return precautions.  Pt verbalizes understanding and agrees with plan.      I personally performed the services described in this documentation, which was scribed in my presence. The recorded information has been reviewed and is accurate.   Trixie Dredge, PA-C 01/11/15 2238  Benjiman Core, MD 01/14/15 [phone]

## 2015-01-11 NOTE — ED Notes (Signed)
The pt is c/o a cold cough body aches chills and sweating for 3 days.  He has chest congestion sinus congestion  Minimal productive cough.

## 2016-11-09 IMAGING — CR DG CHEST 2V
2 series · 2 of 2 positions shown · non-contrast
Comparison: None.

CLINICAL DATA: Productive cough and chills for 4 days

EXAM:
CHEST  2 VIEW

[w chest pa]
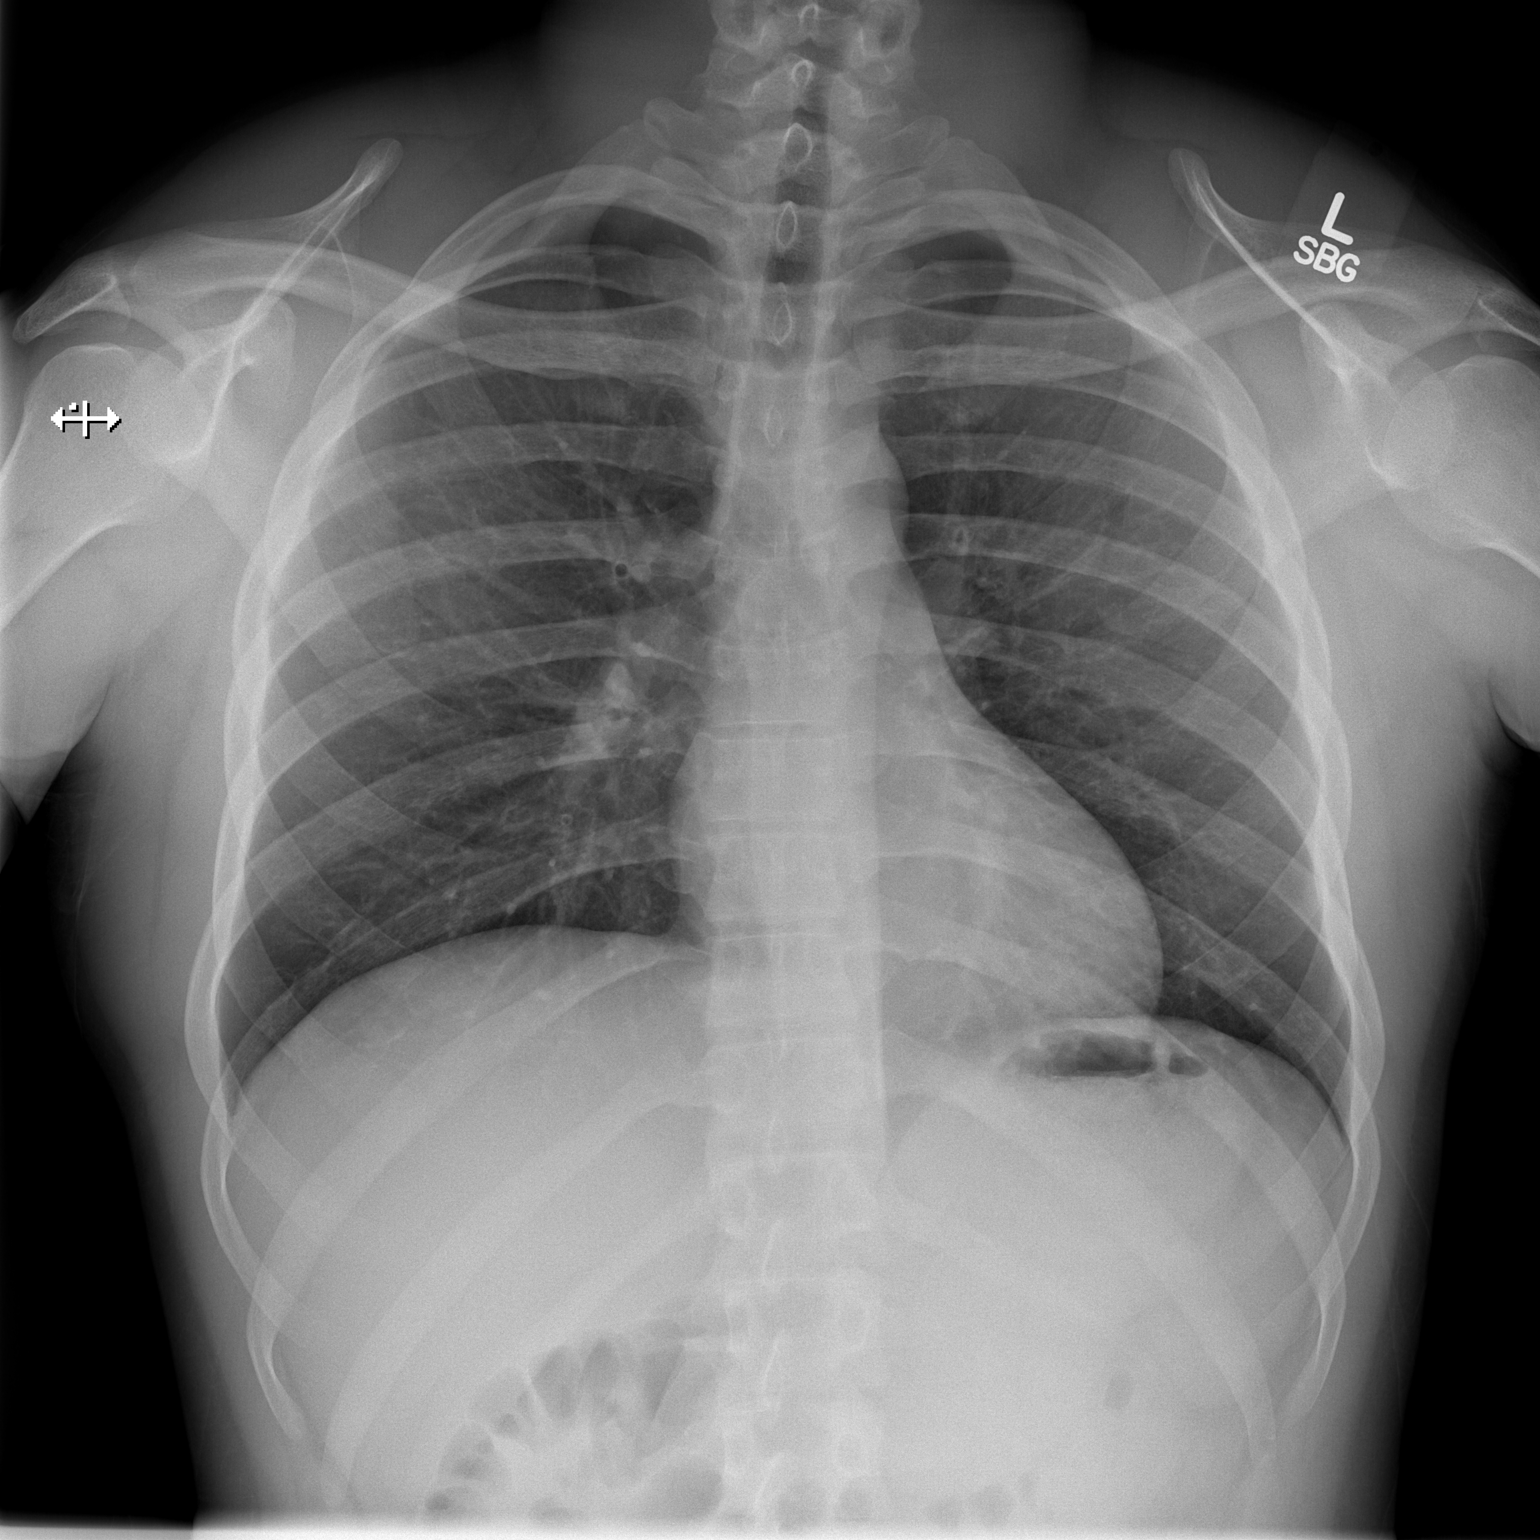

[w chest lat]
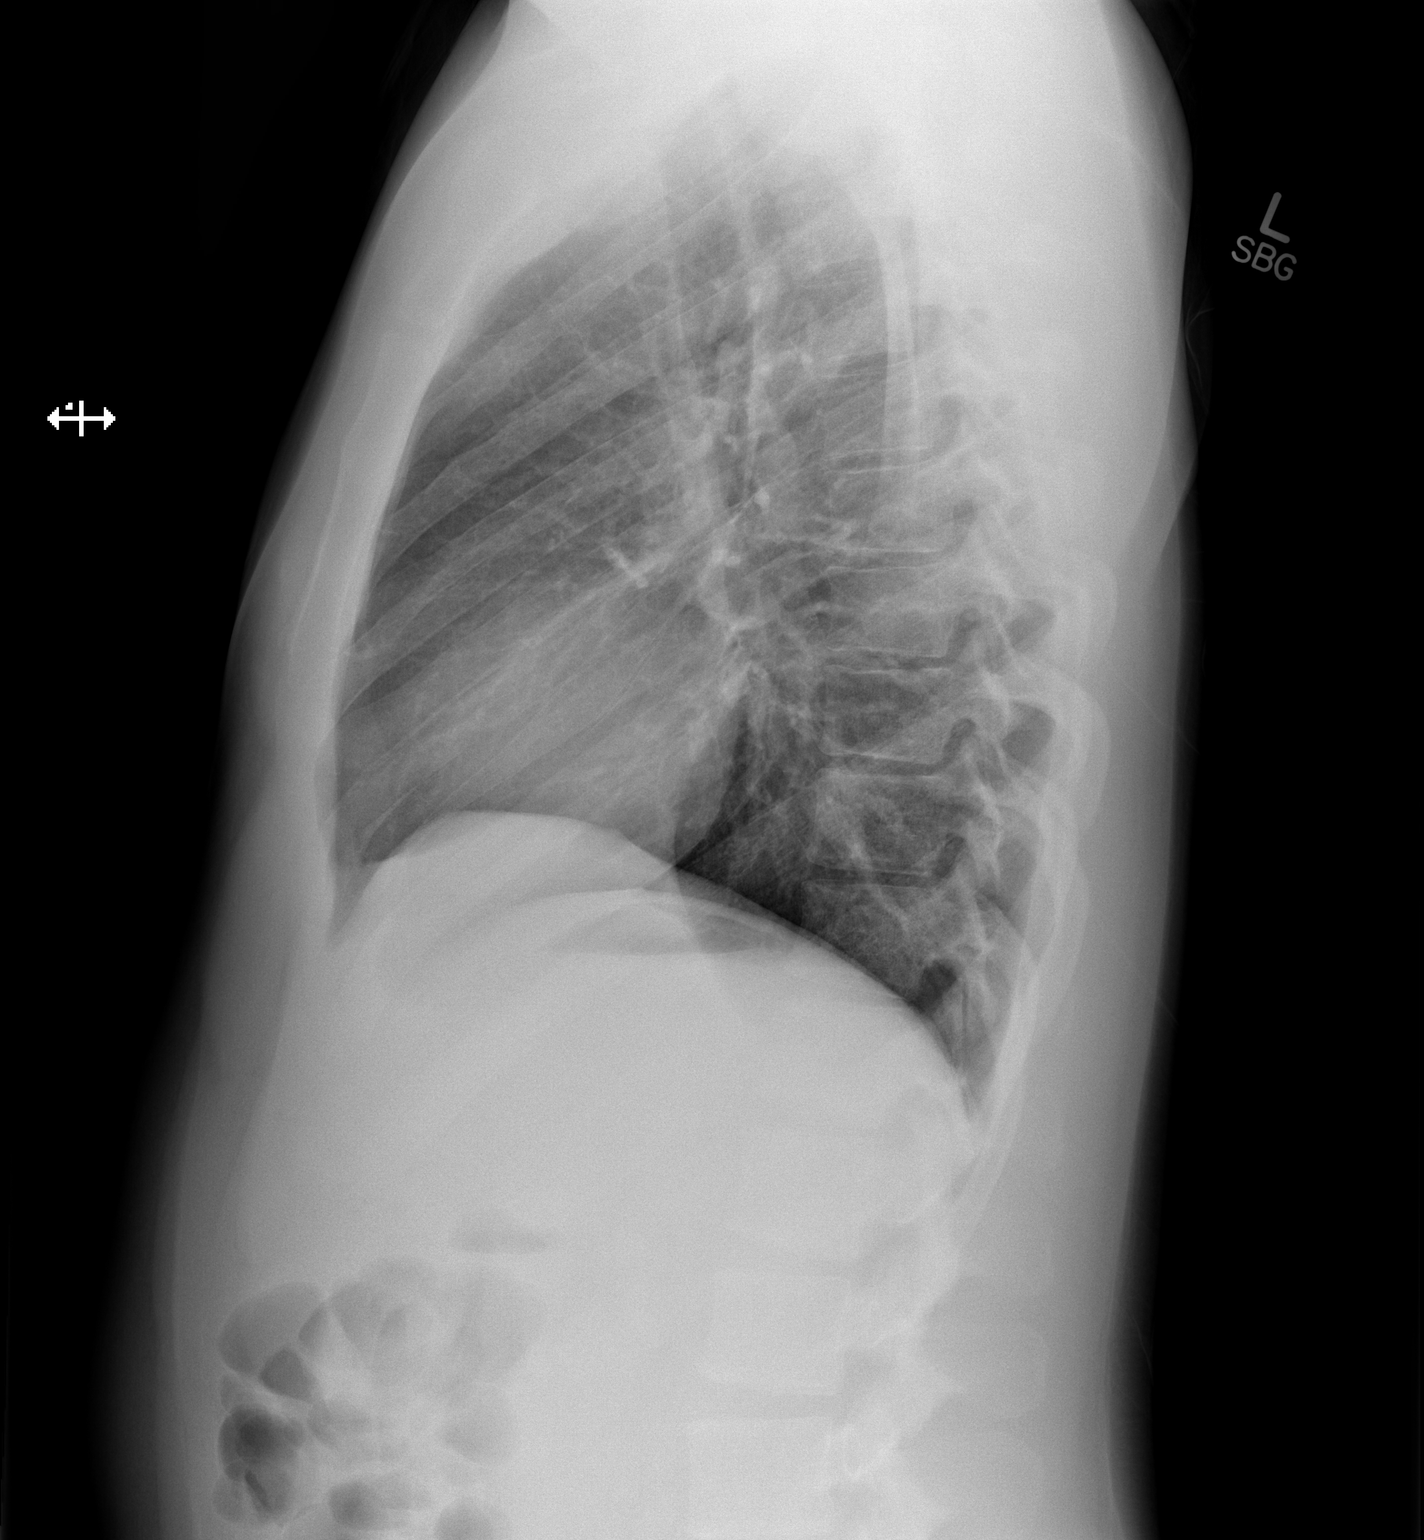

[2 of 2 positions shown; findings below may reference images not displayed]

FINDINGS: There is no edema or consolidation. The heart size and pulmonary
vascularity are normal. No adenopathy. No bone lesions.
IMPRESSION: No edema or consolidation.

## 2019-05-06 ENCOUNTER — Other Ambulatory Visit: Payer: Self-pay

## 2019-05-06 DIAGNOSIS — Z20822 Contact with and (suspected) exposure to covid-19: Secondary | ICD-10-CM

## 2019-05-07 LAB — NOVEL CORONAVIRUS, NAA: SARS-CoV-2, NAA: NOT DETECTED

## 2020-05-14 ENCOUNTER — Other Ambulatory Visit: Payer: Self-pay

## 2020-05-14 ENCOUNTER — Encounter (HOSPITAL_COMMUNITY): Payer: Self-pay

## 2020-05-14 ENCOUNTER — Ambulatory Visit (HOSPITAL_COMMUNITY)
Admission: EM | Admit: 2020-05-14 | Discharge: 2020-05-14 | Disposition: A | Discharge status: Home / Self Care | Payer: Self-pay | Attending: Physician Assistant | Admitting: Physician Assistant

## 2020-05-14 DIAGNOSIS — R21 Rash and other nonspecific skin eruption: Secondary | ICD-10-CM

## 2020-05-14 MED ORDER — CETIRIZINE HCL 10 MG PO TABS: 10.0000 mg | ORAL_TABLET | Freq: Every day | ORAL | 0 refills | Status: AC

## 2020-05-14 MED ORDER — PREDNISONE 10 MG PO TABS: 40.0000 mg | ORAL_TABLET | Freq: Every day | ORAL | 0 refills | Status: AC

## 2020-05-14 MED ORDER — DIPHENHYDRAMINE HCL 25 MG PO TABS: 50.0000 mg | ORAL_TABLET | Freq: Every evening | ORAL | 0 refills | Status: AC | PRN

## 2020-05-14 NOTE — ED Provider Notes (Signed)
[facility]    CSN: [id number] Arrival date & time: 05/14/20  1654      History   Chief Complaint Chief Complaint  Patient presents with  . Allergic Reaction    HPI Christian Ford is a 29 y.o. male.   Patient presents with itchy rash on his upper extremities.  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  He reports this is been present for the last 2 to 3 days.  Reports a similar episode a few weeks ago.  He reports that episode went away after taking Benadryl.  He reports the rash is quite itchy.  He denies other new exposures.  Denies throat swelling or difficulty breathing.  He has never had allergic reactions like this before.  Patient washes dishes in the kitchen industry.  This is only happened twice he says.  Reports he otherwise feels well     History reviewed. No pertinent past medical history.  There are no problems to display for this patient.   History reviewed. No pertinent surgical history.     Home Medications    Prior to Admission medications   Medication Sig Start Date End Date Taking? Authorizing Provider  acetaminophen-codeine (TYLENOL #3) 300-30 MG per tablet Take 1 tablet by mouth every 6 (six) hours as needed for moderate pain. 09/29/14   Christian Helper, PA-C  azithromycin (ZITHROMAX) 250 MG tablet Take 1 tablet (250 mg total) by mouth daily. 09/29/14   Christian Helper, PA-C  cetirizine (ZYRTEC ALLERGY) 10 MG tablet Take 1 tablet (10 mg total) by mouth daily. 05/14/20   Christian Ford, Christian Speak, PA-C  chlorpheniramine-HYDROcodone (TUSSIONEX PENNKINETIC ER) 10-8 MG/5ML LQCR Take 5 mLs by mouth every 12 (twelve) hours as needed for cough (and pain). 01/11/15   Trixie Dredge, PA-C  dicyclomine (BENTYL) 20 MG tablet Take 1 tablet (20 mg total) by mouth 2 (two) times daily. As needed for abdominal cramping Patient not taking: Reported on 09/29/2014 12/08/13   Ward, Layla Maw, DO  diphenhydrAMINE (BENADRYL) 25 MG tablet Take 2 tablets  (50 mg total) by mouth at bedtime as needed. 05/14/20   Christian Ford, Christian Speak, PA-C  Phenyleph-Doxylamine-DM-APAP (ALKA SELTZER PLUS PO) Take by mouth.    [provider]  predniSONE (DELTASONE) 10 MG tablet Take 4 tablets (40 mg total) by mouth daily with breakfast for 5 days. 05/14/20 05/19/20  Christian Ford, Christian Speak, PA-C  promethazine (PHENERGAN) 25 MG tablet Take 1 tablet (25 mg total) by mouth every 6 (six) hours as needed for nausea or vomiting. Patient not taking: Reported on 09/29/2014 12/08/13   Ward, Layla Maw, DO  Pseudoeph-Doxylamine-DM-APAP (NYQUIL PO) Take by mouth.    [provider]  Pseudoephedrine-APAP-DM (DAYQUIL PO) Take 30 mLs by mouth 2 (two) times daily as needed (cold and flu symptoms).     [provider]  Throat Lozenges (COUGH DROPS MT) Use as directed 1 lozenge in the mouth or throat as needed (cough).    [provider]    Family History Family History  Problem Relation Age of Onset  . Hypertension Mother     Social History Social History   Tobacco Use  . Smoking status: Current Every Day Smoker  . Smokeless tobacco: Never Used  Vaping Use  . Vaping Use: Never used  Substance Use Topics  . Alcohol use: Yes  . Drug use: Yes    Types: Marijuana     Allergies   Patient has no known  allergies.   Review of Systems Review of Systems   Physical Exam Triage Vital Signs ED Triage Vitals  Enc Vitals Group     BP 05/14/20 1717 (!) 143/91     Pulse Rate 05/14/20 1723 82     Resp 05/14/20 1717 16     Temp 05/14/20 1717 99.3 F (37.4 C)     Temp Source 05/14/20 1717 Oral     SpO2 05/14/20 1717 97 %     Weight --      Height --      Head Circumference --      Peak Flow --      Pain Score 05/14/20 1719 0     Pain Loc --      Pain Edu? --      Excl. in GC? --    No data found.  Updated Vital Signs BP (!) 152/103 (BP Location: Left Arm)   Pulse 82   Temp 99.3 F (37.4 C) (Oral)   Resp 16   SpO2 92%   Visual Acuity Right  Eye Distance:   Left Eye Distance:   Bilateral Distance:    Right Eye Near:   Left Eye Near:    Bilateral Near:     Physical Exam Vitals and nursing note reviewed.  Constitutional:      Appearance: He is well-developed.  HENT:     Head: Normocephalic and atraumatic.  Eyes:     Conjunctiva/sclera: Conjunctivae normal.  Cardiovascular:     Rate and Rhythm: Normal rate and regular rhythm.     Heart sounds: No murmur heard.   Pulmonary:     Effort: Pulmonary effort is normal. No respiratory distress.     Breath sounds: Normal breath sounds.  Abdominal:     Palpations: Abdomen is soft.     Tenderness: There is no abdominal tenderness.  Musculoskeletal:     Cervical back: Neck supple.  Skin:    General: Skin is warm and dry.     Findings: Rash (Erythematous papular blanching rash on the upper extremities, no significant rash on the torso or face.  No significant rash on the lower extremities.  Rash does appear to be resolving in some places.) present.  Neurological:     Mental Status: He is alert.      UC Treatments / Results  Labs (all labs ordered are listed, but only abnormal results are displayed) Labs Reviewed - No data to display  EKG   Radiology No results found.  Procedures Procedures (including critical care time)  Medications Ordered in UC Medications - No data to display  Initial Impression / Assessment and Plan / UC Course  I have reviewed the triage vital signs and the nursing notes.  Pertinent labs & imaging results that were available during my care of the patient were reviewed by me and considered in my medical decision making (see chart for details).     #Rash Patient is a 29 year old presenting with a pruritic rash of the upper extremities.  Does not have any respiratory evolvement.  Unclear etiology of his reaction.  Will treat with course of prednisone and antihistamines.  Encourage patient to establish primary care.  Discussed return and  follow-up precautions.  Patient verbalized understanding plan of care.  Final Clinical Impressions(s) / UC Diagnoses   Final diagnoses:  Rash and nonspecific skin eruption     Discharge Instructions     Take prednisone as prescribed Take Benadryl at night -Drive, drink alcohol or operate  machinery within 8 hours of taking Benadryl Take Zyrtec daily  Call the family medicine center tomorrow to establish care to discuss allergy testing  If you ever develop shortness of breath, throat swelling and severe rash go to the emergency department      ED Prescriptions    Medication Sig Dispense Auth. Provider   predniSONE (DELTASONE) 10 MG tablet Take 4 tablets (40 mg total) by mouth daily with breakfast for 5 days. 20 tablet Delonte Musich, Christian Speak, PA-C   cetirizine (ZYRTEC ALLERGY) 10 MG tablet Take 1 tablet (10 mg total) by mouth daily. 30 tablet Harrol Novello, Christian Speak, PA-C   diphenhydrAMINE (BENADRYL) 25 MG tablet Take 2 tablets (50 mg total) by mouth at bedtime as needed. 30 tablet Canisha Issac, Christian Speak, PA-C     PDMP not reviewed this encounter.   Hermelinda Medicus, PA-C 05/14/20 2001

## 2020-05-14 NOTE — ED Triage Notes (Signed)
Patient is here today with complaints of allergic reaction to dish detergent used at his job. 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

## 2020-05-14 NOTE — Discharge Instructions (Signed)
Take prednisone as prescribed Take Benadryl at night -Drive, drink alcohol or operate machinery within 8 hours of taking Benadryl Take Zyrtec daily  Call the family medicine center tomorrow to establish care to discuss allergy testing  If you ever develop shortness of breath, throat swelling and severe rash go to the emergency department
